# Patient Record
Sex: Female | Born: 1990 | Race: Black or African American | Hispanic: No | Marital: Single | Smoking: Never smoker
Health system: Southern US, Community
[De-identification: ages and names within clinical notes are randomized; demographics above are authoritative.]

## PROBLEM LIST (undated history)

## (undated) ENCOUNTER — Inpatient Hospital Stay (HOSPITAL_COMMUNITY): Payer: Self-pay

## (undated) DIAGNOSIS — Z789 Other specified health status: Secondary | ICD-10-CM

## (undated) HISTORY — DX: Other specified health status: Z78.9

---

## 2015-03-15 ENCOUNTER — Emergency Department (HOSPITAL_COMMUNITY)
Admission: EM | Admit: 2015-03-15 | Discharge: 2015-03-15 | Discharge status: Absent without leave | Payer: BLUE CROSS/BLUE SHIELD

## 2015-03-15 ENCOUNTER — Encounter (HOSPITAL_COMMUNITY): Payer: Self-pay | Admitting: Emergency Medicine

## 2015-03-15 ENCOUNTER — Emergency Department (HOSPITAL_COMMUNITY)
Admission: EM | Admit: 2015-03-15 | Discharge: 2015-03-15 | Disposition: A | Discharge status: Home / Self Care | Payer: BLUE CROSS/BLUE SHIELD | Attending: Emergency Medicine | Admitting: Emergency Medicine

## 2015-03-15 DIAGNOSIS — R110 Nausea: Secondary | ICD-10-CM | POA: Diagnosis not present

## 2015-03-15 DIAGNOSIS — R067 Sneezing: Secondary | ICD-10-CM | POA: Diagnosis not present

## 2015-03-15 DIAGNOSIS — R509 Fever, unspecified: Secondary | ICD-10-CM | POA: Diagnosis present

## 2015-03-15 DIAGNOSIS — N762 Acute vulvitis: Secondary | ICD-10-CM | POA: Diagnosis not present

## 2015-03-15 DIAGNOSIS — Z791 Long term (current) use of non-steroidal anti-inflammatories (NSAID): Secondary | ICD-10-CM | POA: Insufficient documentation

## 2015-03-15 DIAGNOSIS — L03818 Cellulitis of other sites: Secondary | ICD-10-CM

## 2015-03-15 DIAGNOSIS — Z79899 Other long term (current) drug therapy: Secondary | ICD-10-CM | POA: Diagnosis not present

## 2015-03-15 MED ORDER — CEPHALEXIN 500 MG PO CAPS: 500.0000 mg | ORAL_CAPSULE | Freq: Four times a day (QID) | ORAL | Status: DC

## 2015-03-15 NOTE — ED Notes (Signed)
Pt states she was seen by her OBGYN on Tuesday for an abscess  Pt was told to soak in hot baths several times a day until it came to a head and drained  Pt states today she has had chills, fever, sneezing, dizziness when standing, nausea, and states the pain of the abscess has increased tonight

## 2015-03-15 NOTE — Discharge Instructions (Signed)
Take your antibiotics as prescribed. Do not save or share them. Follow-up with your OB/GYN for reevaluation as needed. Return to ED for  Cellulitis Cellulitis is an infection of the skin and the tissue beneath it. The infected area is usually red and tender. Cellulitis occurs most often in the arms and lower legs.  CAUSES  Cellulitis is caused by bacteria that enter the skin through cracks or cuts in the skin. The most common types of bacteria that cause cellulitis are staphylococci and streptococci. SIGNS AND SYMPTOMS   Redness and warmth.  Swelling.  Tenderness or pain.  Fever. DIAGNOSIS  Your health care provider can usually determine what is wrong based on a physical exam. Blood tests may also be done. TREATMENT  Treatment usually involves taking an antibiotic medicine. HOME CARE INSTRUCTIONS   Take your antibiotic medicine as directed by your health care provider. Finish the antibiotic even if you start to feel better.  Keep the infected arm or leg elevated to reduce swelling.  Apply a warm cloth to the affected area up to 4 times per day to relieve pain.  Take medicines only as directed by your health care provider.  Keep all follow-up visits as directed by your health care provider. SEEK MEDICAL CARE IF:   You notice red streaks coming from the infected area.  Your red area gets larger or turns dark in color.  Your bone or joint underneath the infected area becomes painful after the skin has healed.  Your infection returns in the same area or another area.  You notice a swollen bump in the infected area.  You develop new symptoms.  You have a fever. SEEK IMMEDIATE MEDICAL CARE IF:   You feel very sleepy.  You develop vomiting or diarrhea.  You have a general ill feeling (malaise) with muscle aches and pains.   This information is not intended to replace advice given to you by your health care provider. Make sure you discuss any questions you have with your  health care provider.   Document Released: 10/09/2004 Document Revised: 09/20/2014 Document Reviewed: 03/17/2011 Elsevier Interactive Patient Education [employer]

## 2015-03-15 NOTE — ED Provider Notes (Signed)
CSN: [id number]     Arrival date & time 03/15/15  0232 History   First MD Initiated Contact with Patient 03/15/15 0248     Chief Complaint  Patient presents with  . Fever     (Consider location/radiation/quality/duration/timing/severity/associated sxs/prior Treatment) HPI Sheena Jackson is a 25 y.o. female because in for evaluation of possible abscess. Patient reports she fell and she has an ingrown hair on her vagina that has been there over the past 2 days. She was seen by her OB/GYN on Tuesday for an annual checkup and discussed with them. She clarifies she is not pregnant and was only going for an annual visit. They recommended warm compresses. She reports since her visit she has had intermittent flulike symptoms with fevers, chills, sneezing, nausea. She has not tried anything else to improve her symptoms. Palpation worsens her discomfort. No other modifying factors or medical complaints  History reviewed. No pertinent past medical history. History reviewed. No pertinent past surgical history. History reviewed. No pertinent family history. Social History  Substance Use Topics  . Smoking status: Never Smoker   . Smokeless tobacco: None  . Alcohol Use: Yes     Comment: occ   OB History    No data available     Review of Systems A 10 point review of systems was completed and was negative except for pertinent positives and negatives as mentioned in the history of present illness     Allergies  Review of patient's allergies indicates no known allergies.  Home Medications   Prior to Admission medications   Medication Sig Start Date End Date Taking? Authorizing Provider  cephALEXin (KEFLEX) 500 MG capsule Take 1 capsule (500 mg total) by mouth 4 (four) times daily. 03/15/15   Joycie Peek, PA-C  naproxen sodium (ANAPROX) 220 MG tablet Take 220 mg by mouth 2 (two) times daily with a meal.   Yes Historical Provider, MD  Phenylephrine-Pheniramine-DM [facility] COLD &  COUGH PO) Take 1 capsule by mouth daily.   Yes Historical Provider, MD   BP 120/80 mmHg  Pulse 89  Temp(Src) 97.4 F (36.3 C) (Oral)  Resp 17  SpO2 95%  LMP 03/06/2015 (Approximate) Physical Exam  Constitutional:  Awake, alert, nontoxic appearance.  HENT:  Head: Atraumatic.  Eyes: Right eye exhibits no discharge. Left eye exhibits no discharge.  Neck: Neck supple.  Pulmonary/Chest: Effort normal. She exhibits no tenderness.  Abdominal: Soft. There is no tenderness. There is no rebound.  Genitourinary:  Female chaperone present for exam. Tenderness diffusely throughout inferior aspect of right labia majora. No fluctuance, erythema or overt warmth noted.  Musculoskeletal: She exhibits no tenderness.  Baseline ROM, no obvious new focal weakness.  Neurological:  Mental status and motor strength appears baseline for patient and situation.  Skin: No rash noted.  Psychiatric: She has a normal mood and affect.  Nursing note and vitals reviewed.   ED Course  Procedures (including critical care time) Labs Review Labs Reviewed - No data to display  Imaging Review No results found. I have personally reviewed and evaluated these images and lab results as part of my medical decision-making.   EKG Interpretation None     EMERGENCY DEPARTMENT US SOFT TISSUE INTERPRETATION "Study: Limited Ultrasound of the noted body part in comments below"  INDICATIONS: Soft tissue infection Multiple views of the body part are obtained with a multi-frequency linear probe  PERFORMED BY:  Myself  IMAGES ARCHIVED?: Yes  SIDE:Right   BODY PART:Other soft tisse (comment  in note)  FINDINGS: No abcess noted and Cellulitis present  LIMITATIONS:  Body Habitus  INTERPRETATION:  No abcess noted and Cellulitis present  COMMENT:  Mild cellulitis of right labia. No abscess.  Meds given in ED:  Medications - No data to display  Discharge Medication List as of 03/15/2015  3:33 AM    START taking  these medications   Details  cephALEXin (KEFLEX) 500 MG capsule Take 1 capsule (500 mg total) by mouth 4 (four) times daily., Starting 03/15/2015, Until Discontinued, Print       Filed Vitals:   03/15/15 0239  BP: 120/80  Pulse: 89  Temp: 97.4 F (36.3 C)  TempSrc: Oral  Resp: 17  SpO2: 95%    MDM  Sheena Jackson is a 25 y.o. female who comes in for evaluation of mild perivaginal cellulitis. No abscess on ultrasound. Associated symptoms of intermittent fever, chills, sneezing and nausea likely secondary to viral etiology. We'll be able to follow-up with her OB/GYN for reevaluation. Plan to DC with Keflex. No evidence of other acute or emergent pathology. Patient overall appears ready well, afebrile, hemodynamically stable stable and appropriate for discharge. The patient appears reasonably screened and/or stabilized for discharge and I doubt any other medical condition or other [facility] requiring further screening, evaluation, or treatment in the ED at this time prior to discharge.   Final diagnoses:  Cellulitis of other specified site        Joycie Peek, PA-C 03/15/15 0355  Eber Hong, MD 03/15/15 [phone]

## 2015-06-22 LAB — OB RESULTS CONSOLE ABO/RH: RH Type: POSITIVE

## 2015-06-27 LAB — OB RESULTS CONSOLE GC/CHLAMYDIA
CHLAMYDIA, DNA PROBE: NEGATIVE
GC PROBE AMP, GENITAL: NEGATIVE

## 2015-08-10 LAB — OB RESULTS CONSOLE ANTIBODY SCREEN: ANTIBODY SCREEN: NEGATIVE

## 2015-08-10 LAB — OB RESULTS CONSOLE HGB/HCT, BLOOD
HCT: 36 %
HEMOGLOBIN: 11.5 g/dL

## 2015-08-10 LAB — OB RESULTS CONSOLE RUBELLA ANTIBODY, IGM: RUBELLA: IMMUNE

## 2015-08-10 LAB — OB RESULTS CONSOLE PLATELET COUNT: PLATELETS: 212 10*3/uL

## 2015-08-10 LAB — OB RESULTS CONSOLE HEPATITIS B SURFACE ANTIGEN: HEP B S AG: NEGATIVE

## 2015-08-10 LAB — OB RESULTS CONSOLE HIV ANTIBODY (ROUTINE TESTING): HIV: NONREACTIVE

## 2015-08-10 LAB — OB RESULTS CONSOLE RPR: RPR: NONREACTIVE

## 2015-10-19 ENCOUNTER — Encounter (HOSPITAL_COMMUNITY): Payer: Self-pay | Admitting: Obstetrics and Gynecology

## 2015-10-24 ENCOUNTER — Other Ambulatory Visit (HOSPITAL_COMMUNITY): Payer: Self-pay | Admitting: Obstetrics and Gynecology

## 2015-10-24 DIAGNOSIS — Z3A24 24 weeks gestation of pregnancy: Secondary | ICD-10-CM

## 2015-10-24 DIAGNOSIS — Z3689 Encounter for other specified antenatal screening: Secondary | ICD-10-CM

## 2015-10-25 ENCOUNTER — Encounter: Payer: Self-pay | Admitting: Obstetrics and Gynecology

## 2015-10-29 ENCOUNTER — Encounter (HOSPITAL_COMMUNITY): Payer: Self-pay

## 2015-10-29 ENCOUNTER — Inpatient Hospital Stay (HOSPITAL_COMMUNITY)
Admission: AD | Admit: 2015-10-29 | Discharge: 2015-10-29 | Disposition: A | Discharge status: Home / Self Care | Payer: BLUE CROSS/BLUE SHIELD | Source: Ambulatory Visit | Attending: Obstetrics & Gynecology | Admitting: Obstetrics & Gynecology

## 2015-10-29 DIAGNOSIS — Z3A24 24 weeks gestation of pregnancy: Secondary | ICD-10-CM | POA: Insufficient documentation

## 2015-10-29 DIAGNOSIS — O4692 Antepartum hemorrhage, unspecified, second trimester: Secondary | ICD-10-CM | POA: Insufficient documentation

## 2015-10-29 LAB — URINALYSIS, ROUTINE W REFLEX MICROSCOPIC
BILIRUBIN URINE: NEGATIVE
Glucose, UA: NEGATIVE mg/dL
Ketones, ur: NEGATIVE mg/dL
Leukocytes, UA: NEGATIVE
Nitrite: NEGATIVE
PROTEIN: NEGATIVE mg/dL
Specific Gravity, Urine: <1.005 — ABNORMAL LOW (ref 1.005–1.030)
pH: 5.5 (ref 5.0–8.0)

## 2015-10-29 LAB — WET PREP, GENITAL
Clue Cells Wet Prep HPF POC: NONE SEEN
SPERM: NONE SEEN
Trich, Wet Prep: NONE SEEN
Yeast Wet Prep HPF POC: NONE SEEN

## 2015-10-29 LAB — URINE MICROSCOPIC-ADD ON: WBC, UA: NONE SEEN WBC/hpf (ref 0–5)

## 2015-10-29 NOTE — MAU Note (Signed)
Pt presents complaining of vaginal bleeding after intercourse that started an hour ago. States it was a lot at first and now is spotting when she wipes. Denies pain. Reports decreased fetal movement. Last felt baby move earlier today. Denies abnormal discharge.

## 2015-10-29 NOTE — MAU Provider Note (Signed)
History     CSN: [id number]  Arrival date and time: 10/29/15 1859   First Provider Initiated Contact with Patient 10/29/15 1933      Chief Complaint  Patient presents with  . Vaginal Bleeding   HPI Sheena Jackson is a G1 P0 at 24 weeks and 2 days. She had intercourse this afternoon and then reported bright red bleeding afterwards and when she wiped. She is not actively bleeding now. She has denies cramping or leaking of fluid; she denies pain with urination or lower back pain. She reports positive fetal movements. She does not feel that she is at risk for any STIs.  OB History    Gravida Para Term Preterm AB Living   1             SAB TAB Ectopic Multiple Live Births                  History reviewed. No pertinent past medical history.  History reviewed. No pertinent surgical history.  History reviewed. No pertinent family history.  Social History  Substance Use Topics  . Smoking status: Never Smoker  . Smokeless tobacco: Not on file  . Alcohol use Yes     Comment: occ    Allergies: No Known Allergies  Prescriptions Prior to Admission  Medication Sig Dispense Refill Last Dose  . Phenylephrine-Pheniramine-DM (THERAFLU COLD & COUGH PO) Take 1 capsule by mouth daily.   03/14/2015 at Unknown time    Review of Systems  Constitutional: Negative.   HENT: Negative.   Eyes: Negative.   Respiratory: Negative.   Cardiovascular: Negative.   Gastrointestinal: Negative.   Genitourinary: Negative.   Musculoskeletal: Negative.   Skin: Negative.   Neurological: Negative.   Endo/Heme/Allergies: Negative.    Physical Exam   Blood pressure (!) 114/54, pulse 74, temperature 98 F (36.7 C), temperature source Oral, resp. rate 18, last menstrual period 03/06/2015.  Physical Exam  Constitutional: She is oriented to person, place, and time. She appears well-developed.  HENT:  Head: Normocephalic and atraumatic.  Neck: Normal range of motion.  Cardiovascular: Normal rate.    Respiratory: Effort normal.  Genitourinary: Vagina normal.  Musculoskeletal: Normal range of motion.  Neurological: She is alert and oriented to person, place, and time.  Skin: Skin is warm and dry.   Cervix is friable with blood pooling in the posterior vaginal fornix. No CMT. Vagina and labia are normal appearing without lesions or masses.    Results for orders placed or performed during the hospital encounter of 10/29/15 (from the past 24 hour(s))  Urinalysis, Routine w reflex microscopic ([facility])     Status: Abnormal   Collection Time: 10/29/15  7:04 PM  Result Value Ref Range   Color, Urine YELLOW YELLOW   APPearance CLEAR CLEAR   Specific Gravity, Urine <1.005 (L) 1.005 - 1.030   pH 5.5 5.0 - 8.0   Glucose, UA NEGATIVE NEGATIVE mg/dL   Hgb urine dipstick MODERATE (A) NEGATIVE   Bilirubin Urine NEGATIVE NEGATIVE   Ketones, ur NEGATIVE NEGATIVE mg/dL   Protein, ur NEGATIVE NEGATIVE mg/dL   Nitrite NEGATIVE NEGATIVE   Leukocytes, UA NEGATIVE NEGATIVE  Urine microscopic-add on     Status: Abnormal   Collection Time: 10/29/15  7:04 PM  Result Value Ref Range   Squamous Epithelial / LPF 0-5 (A) NONE SEEN   WBC, UA NONE SEEN 0 - 5 WBC/hpf   RBC / HPF 0-5 0 - 5 RBC/hpf   Bacteria, UA  RARE (A) NONE SEEN  Wet prep, genital     Status: Abnormal   Collection Time: 10/29/15  8:35 PM  Result Value Ref Range   Yeast Wet Prep HPF POC NONE SEEN NONE SEEN   Trich, Wet Prep NONE SEEN NONE SEEN   Clue Cells Wet Prep HPF POC NONE SEEN NONE SEEN   WBC, Wet Prep HPF POC FEW (A) NONE SEEN   Sperm NONE SEEN    MAU Course  Procedures Pelvic exam, UA, wet mount, GCT MDM Wet mount and GC pending.  D/W Dr. Mora Appl ok for DC home. Treat if anything shows on wet prep.   Assessment and Plan  Patient is a G1 P0 here for vaginal bleeding after intercourse. She denies cramping or abdominal pain; no pain with urination and no vaginal discharge. Wet mount and GC pending. Per Dr. Mora Appl, await  results of wet mount and treat as appropriate.  FHR is 140 with accelerations; no decelerations. No contractions present.   Charlesetta Garibaldi Kooistra CNM 10/29/2015, 7:53 PM

## 2015-10-30 ENCOUNTER — Encounter: Payer: Self-pay | Admitting: *Deleted

## 2015-10-30 ENCOUNTER — Other Ambulatory Visit (HOSPITAL_COMMUNITY): Payer: BLUE CROSS/BLUE SHIELD

## 2015-10-30 LAB — GC/CHLAMYDIA PROBE AMP (~~LOC~~) NOT AT ARMC
CHLAMYDIA, DNA PROBE: NEGATIVE
Neisseria Gonorrhea: NEGATIVE

## 2015-11-01 ENCOUNTER — Ambulatory Visit (HOSPITAL_COMMUNITY)
Admission: RE | Admit: 2015-11-01 | Discharge: 2015-11-01 | Disposition: A | Discharge status: Home / Self Care | Payer: BLUE CROSS/BLUE SHIELD | Source: Ambulatory Visit | Attending: Obstetrics and Gynecology | Admitting: Obstetrics and Gynecology

## 2015-11-01 ENCOUNTER — Other Ambulatory Visit (HOSPITAL_COMMUNITY): Payer: Self-pay | Admitting: Obstetrics and Gynecology

## 2015-11-01 DIAGNOSIS — O0932 Supervision of pregnancy with insufficient antenatal care, second trimester: Secondary | ICD-10-CM | POA: Diagnosis not present

## 2015-11-01 DIAGNOSIS — Z3689 Encounter for other specified antenatal screening: Secondary | ICD-10-CM

## 2015-11-01 DIAGNOSIS — Z3A24 24 weeks gestation of pregnancy: Secondary | ICD-10-CM

## 2015-11-01 DIAGNOSIS — Z363 Encounter for antenatal screening for malformations: Secondary | ICD-10-CM

## 2015-11-06 ENCOUNTER — Encounter: Payer: BLUE CROSS/BLUE SHIELD | Admitting: Obstetrics and Gynecology

## 2015-12-13 ENCOUNTER — Other Ambulatory Visit (HOSPITAL_COMMUNITY): Payer: Self-pay

## 2016-01-14 NOTE — L&D Delivery Note (Signed)
Delivery Note Patient pushed for 10 minutes. At 8:38 PM a viable female was delivered via Vaginal, Spontaneous Delivery (Presentation: Occiput; Anterior ).  APGAR: 9, 9; weight 7 lb 3.5 oz (3275 g).   Placenta status: Manual extraction due to cord avulsion,  Intact.  Cord: 3V with the following complications: None Cord pH: n/a  Anesthesia:  Epidural Episiotomy: None Lacerations: None Suture Repair: n/a Est. Blood Loss (mL): 200  Mom to postpartum.  Baby to Couplet care / Skin to Skin.  [facility] BayDYANNA GEFFEL Dijuan Sleeth 02/18/2016, 9:47 PM

## 2016-01-18 ENCOUNTER — Other Ambulatory Visit: Payer: Self-pay | Admitting: Obstetrics & Gynecology

## 2016-01-18 LAB — OB RESULTS CONSOLE GBS: STREP GROUP B AG: NEGATIVE

## 2016-02-18 ENCOUNTER — Inpatient Hospital Stay (HOSPITAL_COMMUNITY): Payer: BC Managed Care – PPO | Admitting: Anesthesiology

## 2016-02-18 ENCOUNTER — Encounter (HOSPITAL_COMMUNITY): Payer: Self-pay

## 2016-02-18 ENCOUNTER — Inpatient Hospital Stay (HOSPITAL_COMMUNITY)
Admission: AD | Admit: 2016-02-18 | Discharge: 2016-02-20 | DRG: 774 | Disposition: A | Discharge status: Home / Self Care | Payer: BC Managed Care – PPO | Source: Ambulatory Visit | Attending: Obstetrics | Admitting: Obstetrics

## 2016-02-18 DIAGNOSIS — A6000 Herpesviral infection of urogenital system, unspecified: Secondary | ICD-10-CM | POA: Diagnosis present

## 2016-02-18 DIAGNOSIS — O9832 Other infections with a predominantly sexual mode of transmission complicating childbirth: Secondary | ICD-10-CM | POA: Diagnosis present

## 2016-02-18 DIAGNOSIS — Z3A40 40 weeks gestation of pregnancy: Secondary | ICD-10-CM | POA: Diagnosis not present

## 2016-02-18 DIAGNOSIS — O368130 Decreased fetal movements, third trimester, not applicable or unspecified: Secondary | ICD-10-CM | POA: Diagnosis present

## 2016-02-18 DIAGNOSIS — Z3493 Encounter for supervision of normal pregnancy, unspecified, third trimester: Secondary | ICD-10-CM | POA: Diagnosis present

## 2016-02-18 LAB — CBC
HEMATOCRIT: 37.6 % (ref 36.0–46.0)
HEMOGLOBIN: 12.2 g/dL (ref 12.0–15.0)
MCH: 29.2 pg (ref 26.0–34.0)
MCHC: 32.4 g/dL (ref 30.0–36.0)
MCV: 90 fL (ref 78.0–100.0)
Platelets: 134 10*3/uL — ABNORMAL LOW (ref 150–400)
RBC: 4.18 MIL/uL (ref 3.87–5.11)
RDW: 14 % (ref 11.5–15.5)
WBC: 8.4 10*3/uL (ref 4.0–10.5)

## 2016-02-18 LAB — ABO/RH: ABO/RH(D): O POS

## 2016-02-18 LAB — TYPE AND SCREEN
ABO/RH(D): O POS
ANTIBODY SCREEN: NEGATIVE

## 2016-02-18 MED ORDER — EPHEDRINE 5 MG/ML INJ
10.0000 mg | INTRAVENOUS | Status: DC | PRN
Start: 2016-02-18 — End: 2016-02-18

## 2016-02-18 MED ORDER — ONDANSETRON HCL 4 MG PO TABS
4.0000 mg | ORAL_TABLET | ORAL | Status: DC | PRN
Start: 2016-02-18 — End: 2016-02-20

## 2016-02-18 MED ORDER — LACTATED RINGERS IV SOLN: 500.0000 mL | INTRAVENOUS | Status: DC | PRN

## 2016-02-18 MED ORDER — LACTATED RINGERS IV SOLN
500.0000 mL | Freq: Once | INTRAVENOUS | Status: AC
  Administered 2016-02-18: 500 mL via INTRAVENOUS

## 2016-02-18 MED ORDER — DIPHENHYDRAMINE HCL 25 MG PO CAPS: 25.0000 mg | ORAL_CAPSULE | Freq: Four times a day (QID) | ORAL | Status: DC | PRN

## 2016-02-18 MED ORDER — OXYCODONE HCL 5 MG PO TABS: 5.0000 mg | ORAL_TABLET | ORAL | Status: DC | PRN

## 2016-02-18 MED ORDER — FENTANYL 2.5 MCG/ML BUPIVACAINE 1/10 % EPIDURAL INFUSION (WH - ANES)
14.0000 mL/h | INTRAMUSCULAR | Status: DC | PRN
  Administered 2016-02-18: 14 mL/h via EPIDURAL
  Filled 2016-02-18: qty 100

## 2016-02-18 MED ORDER — WITCH HAZEL-GLYCERIN EX PADS: 1.0000 "application " | MEDICATED_PAD | CUTANEOUS | Status: DC | PRN

## 2016-02-18 MED ORDER — OXYTOCIN BOLUS FROM INFUSION
500.0000 mL | Freq: Once | INTRAVENOUS | Status: AC
  Administered 2016-02-18: 500 mL via INTRAVENOUS

## 2016-02-18 MED ORDER — ACETAMINOPHEN 325 MG PO TABS: 650.0000 mg | ORAL_TABLET | ORAL | Status: DC | PRN

## 2016-02-18 MED ORDER — PHENYLEPHRINE 40 MCG/ML (10ML) SYRINGE FOR IV PUSH (FOR BLOOD PRESSURE SUPPORT): 80.0000 ug | PREFILLED_SYRINGE | INTRAVENOUS | Status: DC | PRN

## 2016-02-18 MED ORDER — SIMETHICONE 80 MG PO CHEW: 80.0000 mg | CHEWABLE_TABLET | ORAL | Status: DC | PRN

## 2016-02-18 MED ORDER — DIPHENHYDRAMINE HCL 50 MG/ML IJ SOLN: 12.5000 mg | INTRAMUSCULAR | Status: DC | PRN

## 2016-02-18 MED ORDER — FENTANYL CITRATE (PF) 100 MCG/2ML IJ SOLN: 50.0000 ug | INTRAMUSCULAR | Status: DC | PRN

## 2016-02-18 MED ORDER — OXYCODONE-ACETAMINOPHEN 5-325 MG PO TABS: 1.0000 | ORAL_TABLET | ORAL | Status: DC | PRN

## 2016-02-18 MED ORDER — OXYCODONE-ACETAMINOPHEN 5-325 MG PO TABS: 2.0000 | ORAL_TABLET | ORAL | Status: DC | PRN

## 2016-02-18 MED ORDER — SENNOSIDES-DOCUSATE SODIUM 8.6-50 MG PO TABS
2.0000 | ORAL_TABLET | ORAL | Status: DC
  Administered 2016-02-19 (×2): 2 via ORAL
  Filled 2016-02-18 (×2): qty 2

## 2016-02-18 MED ORDER — OXYCODONE HCL 5 MG PO TABS: 10.0000 mg | ORAL_TABLET | ORAL | Status: DC | PRN

## 2016-02-18 MED ORDER — DIBUCAINE 1 % RE OINT: 1.0000 "application " | TOPICAL_OINTMENT | RECTAL | Status: DC | PRN

## 2016-02-18 MED ORDER — PRENATAL MULTIVITAMIN CH
1.0000 | ORAL_TABLET | Freq: Every day | ORAL | Status: DC
  Administered 2016-02-19 – 2016-02-20 (×2): 1 via ORAL
  Filled 2016-02-18 (×2): qty 1

## 2016-02-18 MED ORDER — LACTATED RINGERS IV SOLN
INTRAVENOUS | Status: DC
  Administered 2016-02-18: 17:00:00 via INTRAVENOUS

## 2016-02-18 MED ORDER — ONDANSETRON HCL 4 MG/2ML IJ SOLN: 4.0000 mg | INTRAMUSCULAR | Status: DC | PRN

## 2016-02-18 MED ORDER — BENZOCAINE-MENTHOL 20-0.5 % EX AERO
1.0000 "application " | INHALATION_SPRAY | CUTANEOUS | Status: DC | PRN
  Administered 2016-02-19: 1 via TOPICAL
  Filled 2016-02-18: qty 56

## 2016-02-18 MED ORDER — TETANUS-DIPHTH-ACELL PERTUSSIS 5-2.5-18.5 LF-MCG/0.5 IM SUSP
0.5000 mL | Freq: Once | INTRAMUSCULAR | Status: DC
  Filled 2016-02-18: qty 0.5

## 2016-02-18 MED ORDER — EPHEDRINE 5 MG/ML INJ: 10.0000 mg | INTRAVENOUS | Status: DC | PRN

## 2016-02-18 MED ORDER — SOD CITRATE-CITRIC ACID 500-334 MG/5ML PO SOLN: 30.0000 mL | ORAL | Status: DC | PRN

## 2016-02-18 MED ORDER — LIDOCAINE HCL (PF) 1 % IJ SOLN
INTRAMUSCULAR | Status: DC | PRN
Start: 2016-02-18 — End: 2016-02-18
  Administered 2016-02-18 (×2): 5 mL

## 2016-02-18 MED ORDER — COCONUT OIL OIL: 1.0000 "application " | TOPICAL_OIL | Status: DC | PRN

## 2016-02-18 MED ORDER — OXYTOCIN 40 UNITS IN LACTATED RINGERS INFUSION - SIMPLE MED
2.5000 [IU]/h | INTRAVENOUS | Status: DC
  Administered 2016-02-18: 2.5 [IU]/h via INTRAVENOUS
  Filled 2016-02-18: qty 1000

## 2016-02-18 MED ORDER — ONDANSETRON HCL 4 MG/2ML IJ SOLN: 4.0000 mg | Freq: Four times a day (QID) | INTRAMUSCULAR | Status: DC | PRN

## 2016-02-18 MED ORDER — LIDOCAINE HCL (PF) 1 % IJ SOLN
30.0000 mL | INTRAMUSCULAR | Status: DC | PRN
  Filled 2016-02-18: qty 30

## 2016-02-18 MED ORDER — IBUPROFEN 600 MG PO TABS
600.0000 mg | ORAL_TABLET | Freq: Four times a day (QID) | ORAL | Status: DC
  Administered 2016-02-19 – 2016-02-20 (×7): 600 mg via ORAL
  Filled 2016-02-18 (×7): qty 1

## 2016-02-18 MED ORDER — PHENYLEPHRINE 40 MCG/ML (10ML) SYRINGE FOR IV PUSH (FOR BLOOD PRESSURE SUPPORT)
80.0000 ug | PREFILLED_SYRINGE | INTRAVENOUS | Status: DC | PRN
  Filled 2016-02-18: qty 10

## 2016-02-18 NOTE — Anesthesia Procedure Notes (Signed)
Epidural Patient location during procedure: OB  Staffing Anesthesiologist: Phillips GroutARIGNAN, Jenni Thew Performed: anesthesiologist   Preanesthetic Checklist Completed: patient identified, site marked, surgical consent, pre-op evaluation, timeout performed, IV checked, risks and benefits discussed and monitors and equipment checked  Epidural Patient position: sitting Prep: DuraPrep Patient monitoring: heart rate, continuous pulse ox and blood pressure Approach: midline Location: L3-L4 Injection technique: LOR saline  Needle:  Needle type: Tuohy  Needle gauge: 17 G Needle length: 9 cm and 9 Needle insertion depth: 5 cm Catheter type: closed end flexible Catheter size: 20 Guage Catheter at skin depth: 10 cm Test dose: negative  Assessment Events: blood not aspirated, injection not painful, no injection resistance, negative IV test and no paresthesia  Additional Notes Patient identified. Risks/Benefits/Options discussed with patient including but not limited to bleeding, infection, nerve damage, paralysis, failed block, incomplete pain control, headache, blood pressure changes, nausea, vomiting, reactions to medication both or allergic, itching and postpartum back pain. Confirmed with bedside nurse the patient's most recent platelet count. Confirmed with patient that they are not currently taking any anticoagulation, have any bleeding history or any family history of bleeding disorders. Patient expressed understanding and wished to proceed. All questions were answered. Sterile technique was used throughout the entire procedure. Please see nursing notes for vital signs. Test dose was given through epidural needle and negative prior to continuing to dose epidural or start infusion. Warning signs of high block given to the patient including shortness of breath, tingling/numbness in hands, complete motor block, or any concerning symptoms with instructions to call for help. Patient was given instructions  on fall risk and not to get out of bed. All questions and concerns addressed with instructions to call with any issues.

## 2016-02-18 NOTE — Anesthesia Preprocedure Evaluation (Signed)

## 2016-02-18 NOTE — H&P (Signed)
26 y.o. G1P0 @ 5341w2d presents with painful contractions.  Was seen in the office today for decreased fetal movement and had a reactive NST.  Otherwise has good fetal movement and no bleeding.  Pregnancy c/b:  1. 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  Last outbreak > 1 yr ago.  No prodromal symptoms  Past Medical History:  Diagnosis Date  . Medical history non-contributory    History reviewed. No pertinent surgical history.  OB History  Gravida Para Term Preterm AB Living  1            SAB TAB Ectopic Multiple Live Births               # Outcome Date GA Lbr Len/2nd Weight Sex Delivery Anes PTL Lv  1 Current               Social History   Social History  . Marital status: Single    Spouse name: N/A  . Number of children: N/A  . Years of education: N/A   Occupational History  . Not on file.   Social History Main Topics  . Smoking status: Never Smoker  . Smokeless tobacco: Not on file  . Alcohol use Yes     Comment: occ  . Drug use: No  . Sexual activity: Yes    Birth control/ protection: None   Other Topics Concern  . Not on file   Social History Narrative  . No narrative on file   Patient has no known allergies.    Prenatal Transfer Tool  Maternal Diabetes: No Genetic Screening: Normal  NIPT low risk, neg CF screen Maternal Ultrasounds/Referrals: Normal Fetal Ultrasounds or other Referrals:  None Maternal Substance Abuse:  No Significant Maternal Medications:  Meds include: Other:  valtrex Significant Maternal Lab Results: None  ABO, Rh: --/--/O POS (02/05 1640) Antibody: PENDING (02/05 1640) Rubella: Immune (07/28 0000) RPR: Nonreactive (07/28 0000)  HBsAg: Negative (07/28 0000)  HIV: Non-reactive (07/28 0000)  GBS: Negative (01/05 0000)      Vitals:   02/18/16 1607 02/18/16 1628  BP: 126/63 129/85  Pulse: 77 73  Resp: 20 20  Temp: 97.6 F (36.4 C) 97.9 F (36.6 C)     General:  NAD Abdomen:  soft, gravid, EFW 6.5# Ex:  no  edema SVE:  6/90/-1 per RN SSE:  No lesions of vulva, vagina, or cervix FHTs:  150s, mod var, + accels, no decels Toco:  q2-3 min   A/P   26 y.o. G1P0 9841w2d presents with labor Admit to L&D Epidural now HSV--no lesions, no prodromal symptoms FSR/ vtx/ GBS neg  Jari Dipasquale GEFFEL Miliyah Luper

## 2016-02-18 NOTE — MAU Note (Signed)
Seen in office today, was 80/2+.  Membranes stripped.  Small amt of bleeding since. First baby, no probles with preg

## 2016-02-19 LAB — CBC
HEMATOCRIT: 33.4 % — AB (ref 36.0–46.0)
HEMOGLOBIN: 11 g/dL — AB (ref 12.0–15.0)
MCH: 29.5 pg (ref 26.0–34.0)
MCHC: 32.9 g/dL (ref 30.0–36.0)
MCV: 89.5 fL (ref 78.0–100.0)
Platelets: 126 10*3/uL — ABNORMAL LOW (ref 150–400)
RBC: 3.73 MIL/uL — AB (ref 3.87–5.11)
RDW: 14.1 % (ref 11.5–15.5)
WBC: 12.7 10*3/uL — ABNORMAL HIGH (ref 4.0–10.5)

## 2016-02-19 LAB — RPR: RPR Ser Ql: NONREACTIVE

## 2016-02-19 NOTE — Lactation Note (Signed)
This note was copied from a baby's chart. Lactation Consultation Note  Patient Name: Girl Joaquim Lailexis Montante ZOXWR'UToday's Date: 02/19/2016    Baby 16 hours old.  P1.  Maternal Grandparents in room. Baby is cueing.  Suggest mother breastfeed.  Mother states she wants to breastfeed to give baby colostrum but is unsure how long she wants to breastfeed. Maternal Grandmother stating baby is hungry and should give baby formula. LC suggest she will support mother however she wants to feed her baby. Provided education and assisted w/ latching.  Baby is cluster feeding.  Explained to family. Mom encouraged to feed baby 8-12 times/24 hours and with feeding cues.  Mother seems unsure at this time.  Encouraged breastfeeding but want to support family. Recommend mother call if she would like assistance.   Maternal Data    Feeding    LATCH Score/Interventions                      Lactation Tools Discussed/Used     Consult Status      Hardie PulleyBerkelhammer, Ruth Boschen 02/19/2016, 1:40 PM

## 2016-02-19 NOTE — Lactation Note (Addendum)
This note was copied from a baby's chart. Lactation Consultation Note New mom has erect cone shaped breast w/everted nipples on bulbous areolas. Hand expression taught w/return demonstration of easy flow of colostrum. Latched baby in football hold. Heard audible swallows.  New parents had lots of questions. Education of newborn behavior, STS, I&O, cluster feeding, supply and demand. [facility] completing consult, asked mom what was her feeding plan when she goes home. Mom only plans to BF while she has colostrum, then she if switching to formula at home. Mom is going to work and be not be pumping at work. Praised mom for easy flow of colostrum and she was giving baby great nutrition. Mom stated she knew that, that is why she is going to give the colostrum. Mom stated she didn't want to BF all the time, she wanted to give bottles and formula.  [facility] brochure given w/resources, support groups and LC services. LC hoping mom will change her mind when returns home. Baby latched so well, has a great colostrum supply.  Patient Name: Sheena Jackson ZOXWR'UToday's Date: 02/19/2016 Reason for consult: Initial assessment   Maternal Data Has patient been taught Hand Expression?: Yes Does the patient have breastfeeding experience prior to this delivery?: No  Feeding Feeding Type: Breast Fed Length of feed: 30 min  LATCH Score/Interventions Latch: Grasps breast easily, tongue down, lips flanged, rhythmical sucking. Intervention(s): Adjust position;Assist with latch;Breast massage;Breast compression  Audible Swallowing: Spontaneous and intermittent Intervention(s): Skin to skin;Hand expression Intervention(s): Alternate breast massage  Type of Nipple: Everted at rest and after stimulation  Comfort (Breast/Nipple): Soft / non-tender     Hold (Positioning): Assistance needed to correctly position infant at breast and maintain latch. Intervention(s): Breastfeeding basics reviewed;Support Pillows;Position  options;Skin to skin  LATCH Score: 9  Lactation Tools Discussed/Used Tools: Pump Breast pump type: Manual   Consult Status Consult Status: Follow-up Date: 02/20/16 Follow-up type: In-patient    Eleasha Cataldo, Diamond NickelLAURA G 02/19/2016, 5:25 AM

## 2016-02-19 NOTE — Progress Notes (Signed)
PPD#1 Pt without complaints. HGB-11 Lochia wnl IMP/ doing well Plan/ Discharge in am.

## 2016-02-19 NOTE — Anesthesia Postprocedure Evaluation (Signed)
Anesthesia Post Note  Patient: Sheena Jackson  Procedure(s) Performed: * No procedures listed *  Patient location during evaluation: Mother Baby Anesthesia Type: Epidural Level of consciousness: awake Pain management: pain level controlled Vital Signs Assessment: post-procedure vital signs reviewed and stable Respiratory status: spontaneous breathing Cardiovascular status: stable Postop Assessment: no headache, no backache, epidural receding, patient able to bend at knees, no signs of nausea or vomiting and adequate PO intake Anesthetic complications: no        Last Vitals:  Vitals:   02/19/16 0316 02/19/16 0602  BP: 121/70 118/74  Pulse: 73 79  Resp: 18 18  Temp: 36.8 C 36.7 C    Last Pain:  Vitals:   02/19/16 0619  TempSrc:   PainSc: 1    Pain Goal:                 [name]

## 2016-02-20 MED ORDER — IBUPROFEN 600 MG PO TABS: 600.0000 mg | ORAL_TABLET | Freq: Four times a day (QID) | ORAL | 3 refills | Status: AC | PRN

## 2016-02-20 NOTE — Lactation Note (Signed)
This note was copied from a baby's chart. Lactation Consultation Note  Patient Name: Sheena Jackson WUJWJ'XToday's Date: 02/20/2016 Reason for consult: Follow-up assessment Baby recently BF and asleep, Mom resting. LC left phone number and encouraged Mom to call with next feeding.   Maternal Data    Feeding Feeding Type: Breast Fed  LATCH Score/Interventions Latch: Grasps breast easily, tongue down, lips flanged, rhythmical sucking. Intervention(s): Adjust position;Assist with latch;Breast massage  Audible Swallowing: Spontaneous and intermittent  Type of Nipple: Everted at rest and after stimulation  Comfort (Breast/Nipple): Filling, red/small blisters or bruises, mild/mod discomfort  Problem noted: Filling Interventions (Filling): Massage;Hand pump  Hold (Positioning): Assistance needed to correctly position infant at breast and maintain latch.  LATCH Score: 8  Lactation Tools Discussed/Used     Consult Status Consult Status: Follow-up Date: 02/20/16 Follow-up type: In-patient    Alfred LevinsGranger, Kavin Weckwerth Ann 02/20/2016, 10:27 AM

## 2016-02-20 NOTE — Discharge Summary (Signed)
Obstetric Discharge Summary Reason for Admission: onset of labor Prenatal Procedures: NST Intrapartum Procedures: spontaneous vaginal delivery Postpartum Procedures: none Complications-Operative and Postpartum: none Hemoglobin  Date Value Ref Range Status  02/19/2016 11.0 (L) 12.0 - 15.0 g/dL Final  16/10/960407/28/2017 54.011.5 g/dL Final   HCT  Date Value Ref Range Status  02/19/2016 33.4 (L) 36.0 - 46.0 % Final  08/10/2015 36 % Final    Physical Exam:  General: alert, cooperative and no distress Lochia: appropriate Uterine Fundus: firm perineum: healing well, no significant drainage, no dehiscence, no significant erythema DVT Evaluation: No evidence of DVT seen on physical exam. Negative Homan's sign. No cords or calf tenderness. No significant calf/ankle edema.  Discharge Diagnoses: Term Pregnancy-delivered  Discharge Information: Date: 02/20/2016 Activity: pelvic rest Diet: routine Medications: PNV and Ibuprofen Condition: stable Instructions: refer to practice specific booklet Discharge to: home   Newborn Data: Live born female  Birth Weight: 7 lb 3.5 oz (3275 g) APGAR: 9, 9  Home with mother.  Essie HartINN, Hilmer Aliberti STACIA 02/20/2016, 7:41 AM

## 2016-02-20 NOTE — Progress Notes (Signed)
Post Partum Day 2 Subjective: no complaints, up ad lib, voiding, tolerating PO, + flatus and breast feeding  Objective: Blood pressure (!) 105/57, pulse 66, temperature 98.2 F (36.8 C), resp. rate 18, height 5\' 9"  (1.753 m), weight 74.8 kg (165 lb), last menstrual period 05/06/2015, SpO2 98 %, unknown if currently breastfeeding.  Physical Exam:  General: alert, cooperative and no distress Lochia: appropriate Uterine Fundus: firm perineum: healing well, no significant drainage, no dehiscence, no significant erythema DVT Evaluation: No evidence of DVT seen on physical exam. Negative Homan's sign. No cords or calf tenderness. No significant calf/ankle edema.   Recent Labs  02/18/16 1640 02/19/16 0608  HGB 12.2 11.0*  HCT 37.6 33.4*    Assessment/Plan: Discharge home and Breastfeeding   LOS: 2 days   Kevina Piloto STACIA 02/20/2016, 7:38 AM

## 2016-02-20 NOTE — Lactation Note (Signed)
This note was copied from a baby's chart. Lactation Consultation Note  Patient Name: Sheena Jackson [id number]'s Date: 02/20/2016 Reason for consult: Follow-up assessment Mom's breast are filling, Assisted Mom with using breast compression for baby to obtain depth with latch. Baby fussy so took few attempts for baby to sustain latch but once she did, baby demonstrated good suckling bursts with some swallows noted. Encouraged Mom to support breast with feedings. Reviewed importance of deep latch to milk transfer, limit pacifier use, reviewed risk of pacifier use. Advised Mom to pre-pump to help with latch, post pump as needed to prevent engorgement. Advised to give baby back any amount of EBM she receives with pumping. Advised baby should be at breast 8-12 times in 24 hours and with feeding ques. Keep baby nursing 15-30 minutes both breasts when possible. Advised of OP services. Mom has Peds f/u tomorrow. Mom to call for questions/concerns.   Maternal Data    Feeding Feeding Type: Breast Fed Length of feed: 30 min  LATCH Score/Interventions Latch: Repeated attempts needed to sustain latch, nipple held in mouth throughout feeding, stimulation needed to elicit sucking reflex. Intervention(s): Adjust position;Assist with latch;Breast massage;Breast compression  Audible Swallowing: Spontaneous and intermittent  Type of Nipple: Everted at rest and after stimulation  Comfort (Breast/Nipple): Filling, red/small blisters or bruises, mild/mod discomfort  Problem noted: Filling Interventions (Filling): Massage;Hand pump  Hold (Positioning): Assistance needed to correctly position infant at breast and maintain latch. Intervention(s): Breastfeeding basics reviewed;Support Pillows;Position options;Skin to skin  LATCH Score: 7  Lactation Tools Discussed/Used Tools: Pump Breast pump type: Manual   Consult Status Consult Status: Complete Date: 02/20/16 Follow-up type: In-patient    Sheena Jackson,  Sheena Jackson 02/20/2016, 12:23 PM

## 2018-07-13 ENCOUNTER — Other Ambulatory Visit: Payer: Self-pay

## 2018-07-13 ENCOUNTER — Encounter (HOSPITAL_COMMUNITY): Payer: Self-pay

## 2018-07-13 ENCOUNTER — Emergency Department (HOSPITAL_COMMUNITY)
Admission: EM | Admit: 2018-07-13 | Discharge: 2018-07-14 | Discharge status: Against Medical Advice | Payer: BC Managed Care – PPO | Attending: Emergency Medicine | Admitting: Emergency Medicine

## 2018-07-13 DIAGNOSIS — Z5321 Procedure and treatment not carried out due to patient leaving prior to being seen by health care provider: Secondary | ICD-10-CM | POA: Insufficient documentation

## 2018-07-13 DIAGNOSIS — R05 Cough: Secondary | ICD-10-CM | POA: Diagnosis present

## 2018-07-13 NOTE — ED Triage Notes (Signed)
Pt reports sore throat for a week and chills that started today. Also reports tightness in chest and fatigue.  Pt afebrile in triage. Alert & approp.

## 2018-07-14 ENCOUNTER — Other Ambulatory Visit: Payer: Self-pay | Admitting: *Deleted

## 2018-07-14 DIAGNOSIS — Z20822 Contact with and (suspected) exposure to covid-19: Secondary | ICD-10-CM

## 2018-07-14 NOTE — ED Notes (Signed)
Called x3 for vitals. No answer. 

## 2018-07-20 LAB — NOVEL CORONAVIRUS, NAA: SARS-CoV-2, NAA: NOT DETECTED

## 2019-02-14 ENCOUNTER — Emergency Department (HOSPITAL_COMMUNITY): Payer: BC Managed Care – PPO

## 2019-02-14 ENCOUNTER — Emergency Department (HOSPITAL_COMMUNITY)
Admission: EM | Admit: 2019-02-14 | Discharge: 2019-02-14 | Disposition: A | Discharge status: Home / Self Care | Payer: BC Managed Care – PPO | Attending: Emergency Medicine | Admitting: Emergency Medicine

## 2019-02-14 ENCOUNTER — Encounter (HOSPITAL_COMMUNITY): Payer: Self-pay | Admitting: Emergency Medicine

## 2019-02-14 ENCOUNTER — Other Ambulatory Visit: Payer: Self-pay

## 2019-02-14 DIAGNOSIS — Y999 Unspecified external cause status: Secondary | ICD-10-CM | POA: Insufficient documentation

## 2019-02-14 DIAGNOSIS — Y92009 Unspecified place in unspecified non-institutional (private) residence as the place of occurrence of the external cause: Secondary | ICD-10-CM | POA: Insufficient documentation

## 2019-02-14 DIAGNOSIS — S92515A Nondisplaced fracture of proximal phalanx of left lesser toe(s), initial encounter for closed fracture: Secondary | ICD-10-CM

## 2019-02-14 DIAGNOSIS — Y9301 Activity, walking, marching and hiking: Secondary | ICD-10-CM | POA: Diagnosis not present

## 2019-02-14 DIAGNOSIS — R519 Headache, unspecified: Secondary | ICD-10-CM | POA: Insufficient documentation

## 2019-02-14 DIAGNOSIS — R55 Syncope and collapse: Secondary | ICD-10-CM | POA: Insufficient documentation

## 2019-02-14 DIAGNOSIS — S99922A Unspecified injury of left foot, initial encounter: Secondary | ICD-10-CM | POA: Diagnosis present

## 2019-02-14 DIAGNOSIS — W2209XA Striking against other stationary object, initial encounter: Secondary | ICD-10-CM | POA: Insufficient documentation

## 2019-02-14 LAB — I-STAT BETA HCG BLOOD, ED (NOT ORDERABLE): I-stat hCG, quantitative: <5 m[IU]/mL (ref <5)

## 2019-02-14 LAB — URINALYSIS, ROUTINE W REFLEX MICROSCOPIC
Bacteria, UA: NONE SEEN
Bilirubin Urine: NEGATIVE
Glucose, UA: NEGATIVE mg/dL
Hgb urine dipstick: NEGATIVE
Ketones, ur: NEGATIVE mg/dL
Nitrite: NEGATIVE
Protein, ur: NEGATIVE mg/dL
Specific Gravity, Urine: 1.025 (ref 1.005–1.030)
pH: 6 (ref 5.0–8.0)

## 2019-02-14 LAB — CBC
HCT: 38.6 % (ref 36.0–46.0)
Hemoglobin: 12 g/dL (ref 12.0–15.0)
MCH: 28.7 pg (ref 26.0–34.0)
MCHC: 31.1 g/dL (ref 30.0–36.0)
MCV: 92.3 fL (ref 80.0–100.0)
Platelets: 189 10*3/uL (ref 150–400)
RBC: 4.18 MIL/uL (ref 3.87–5.11)
RDW: 12.5 % (ref 11.5–15.5)
WBC: 4.8 10*3/uL (ref 4.0–10.5)
nRBC: 0 % (ref 0.0–0.2)

## 2019-02-14 LAB — BASIC METABOLIC PANEL
Anion gap: 7 (ref 5–15)
BUN: 21 mg/dL — ABNORMAL HIGH (ref 6–20)
CO2: 25 mmol/L (ref 22–32)
Calcium: 9.1 mg/dL (ref 8.9–10.3)
Chloride: 105 mmol/L (ref 98–111)
Creatinine, Ser: 1.08 mg/dL — ABNORMAL HIGH (ref 0.44–1.00)
GFR calc Af Amer: >60 mL/min (ref >60)
GFR calc non Af Amer: >60 mL/min (ref >60)
Glucose, Bld: 98 mg/dL (ref 70–99)
Potassium: 3.9 mmol/L (ref 3.5–5.1)
Sodium: 137 mmol/L (ref 135–145)

## 2019-02-14 MED ORDER — IBUPROFEN 200 MG PO TABS
400.0000 mg | ORAL_TABLET | Freq: Once | ORAL | Status: AC
  Administered 2019-02-14: 400 mg via ORAL
  Filled 2019-02-14: qty 2

## 2019-02-14 MED ORDER — SODIUM CHLORIDE 0.9% FLUSH: 3.0000 mL | Freq: Once | INTRAVENOUS | Status: DC

## 2019-02-14 NOTE — ED Provider Notes (Signed)
[facility] DEPT Provider Note   CSN: [id number] Arrival date & time: 02/14/19  1847     History Chief Complaint  Patient presents with  . Loss of Consciousness  . Toe Injury    Zylah Elsbernd is a 29 y.o. female.  The history is provided by the patient. No language interpreter was used.  Loss of Consciousness  Kajuana Shareef is a 29 y.o. female who presents to the Emergency Department complaining of toe injury, syncope. At home she was walking and accidentally kicked the fireplace with her left second toe. She experienced immediate severe pain and bent over to reach the toe was breathing heavy. She then fell backwards, passing out and striking her head. When she awoke she had mild dizziness and headache. Currently she is feeling improved but does have mild headache and complains of ongoing pain to the toe. She denies any recent illnesses. She denies any fevers, chest pain, Donald pain, nausea, vomiting, leg swelling. She has no medical problems and takes no medications. She does have a Mirena IUD.    Past Medical History:  Diagnosis Date  . Medical history non-contributory     Patient Active Problem List   Diagnosis Date Noted  . Normal labor 02/18/2016    History reviewed. No pertinent surgical history.   OB History    Gravida  1   Para  1   Term  1   Preterm      AB      Living  1     SAB      TAB      Ectopic      Multiple  0   Live Births  1           Family History  Problem Relation Age of Onset  . Hypertension Mother     Social History   Tobacco Use  . Smoking status: Never Smoker  . Smokeless tobacco: Never Used  Substance Use Topics  . Alcohol use: Yes    Comment: occ  . Drug use: No    Home Medications Prior to Admission medications   Medication Sig Start Date End Date Taking? Authorizing Provider  valACYclovir (VALTREX) 500 MG tablet Take 500 mg by mouth 2 (two) times  daily as needed.    Yes [provider]  ibuprofen (ADVIL,MOTRIN) 600 MG tablet Take 1 tablet (600 mg total) by mouth every 6 (six) hours as needed for cramping. Patient not taking: Reported on 02/14/2019 02/20/16   Sanjuana Kava, MD    Allergies    Patient has no known allergies.  Review of Systems   Review of Systems  Cardiovascular: Positive for syncope.  All other systems reviewed and are negative.   Physical Exam Updated Vital Signs BP 116/77   Pulse 74   Temp 98.6 F (37 C) (Oral)   Resp 20   Ht 5\' 9"  (1.753 m)   Wt 70.3 kg   SpO2 100%   BMI 22.89 kg/m   Physical Exam Vitals and nursing note reviewed.  Constitutional:      Appearance: She is well-developed.  HENT:     Head: Normocephalic and atraumatic.  Cardiovascular:     Rate and Rhythm: Normal rate and regular rhythm.     Heart sounds: No murmur.  Pulmonary:     Effort: Pulmonary effort is normal. No respiratory distress.     Breath sounds: Normal breath sounds.  Abdominal:     Palpations:  Abdomen is soft.     Tenderness: There is no abdominal tenderness. There is no guarding or rebound.  Musculoskeletal:     Comments: 2+ DP pulses bilaterally. There is tenderness in mild swelling and ecchymosis to the left second toe. There is no midfoot tenderness to palpation. No calf tenderness or swelling.  Skin:    General: Skin is warm and dry.  Neurological:     Mental Status: She is alert and oriented to person, place, and time.     Comments: Five out of five strength in all four extremities with sensation to light touch intact in all four extremities  Psychiatric:        Behavior: Behavior normal.     ED Results / Procedures / Treatments   Labs (all labs ordered are listed, but only abnormal results are displayed) Labs Reviewed  BASIC METABOLIC PANEL - Abnormal; Notable for the following components:      Result Value   BUN 21 (*)    Creatinine, Ser 1.08 (*)    All other components within normal  limits  URINALYSIS, ROUTINE W REFLEX MICROSCOPIC - Abnormal; Notable for the following components:   Leukocytes,Ua SMALL (*)    All other components within normal limits  CBC  I-STAT BETA HCG BLOOD, ED ([facility], [facility], [facility] ONLY)  I-STAT BETA HCG BLOOD, ED (NOT ORDERABLE)  CBG MONITORING, ED    EKG EKG Interpretation  Date/Time:  Monday February 14 2019 19:15:13 EST Ventricular Rate:  69 PR Interval:    QRS Duration: 98 QT Interval:  401 QTC Calculation: 430 R Axis:   59 Text Interpretation: Sinus rhythm RSR' in V1 or V2, right VCD or RVH no prior available for comparison Confirmed by Tilden Fossa [id number]) on 02/14/2019 9:22:09 PM   Radiology DG Foot Complete Left  Result Date: 02/14/2019 CLINICAL DATA:  Left foot pain status post trauma EXAM: LEFT FOOT - COMPLETE 3+ VIEW COMPARISON:  None. FINDINGS: There is displaced fracture of the second proximal phalanx. No other acute displaced fracture or dislocation is identified. IMPRESSION: Fracture of the second proximal phalanx. Electronically Signed   By: Sherian Rein M.D.   On: 02/14/2019 20:02    Procedures Procedures (including critical care time)  Medications Ordered in ED Medications  sodium chloride flush (NS) 0.9 % injection 3 mL (has no administration in time range)  ibuprofen (ADVIL) tablet 400 mg (has no administration in time range)    ED Course  I have reviewed the triage vital signs and the nursing notes.  Pertinent labs & imaging results that were available during my care of the patient were reviewed by me and considered in my medical decision making (see chart for details).    MDM Rules/Calculators/A&P                      Patient here for evaluation of toe pain, syncopal episode. CP likely secondary to pain response. Presentation is not consistent with subarachnoid hemorrhage, PE, serious closed head injury. She does have his second toe fracture that is not significantly displaced. Discussed with patient home care  with buddy tape, postop shoe. Creatinine is mildly elevated, this may be the patient's baseline. Discussed outpatient PCP follow-up for recheck. Final Clinical Impression(s) / ED Diagnoses Final diagnoses:  Closed nondisplaced fracture of proximal phalanx of lesser toe of left foot, initial encounter  Syncope, unspecified syncope type    Rx / DC Orders ED Discharge Orders    None  Tilden Fossa, MD 02/14/19 2215

## 2019-02-14 NOTE — ED Notes (Signed)
Pt refuses post op shoe.

## 2019-02-14 NOTE — ED Notes (Signed)
Patient ambulated out of the department with no difficulty   

## 2019-02-14 NOTE — Discharge Instructions (Addendum)
Creatinine, which is part of your kidney function was mildly elevated today. Please follow-up with your doctor for recheck in one week.

## 2019-02-14 NOTE — ED Triage Notes (Signed)
Pt reports hitting left second toe on edge of fireplace then bent down and fell and syncopal episode. Pt reports still feeling dizzy at this time and pain to posterior head.

## 2019-02-14 NOTE — ED Notes (Signed)
Save blue in main lab 

## 2021-01-26 IMAGING — CR DG FOOT COMPLETE 3+V*L*
3 series · 3 of 3 positions shown · non-contrast
Comparison: None.

CLINICAL DATA: Left foot pain status post trauma

EXAM:
LEFT FOOT - COMPLETE 3+ VIEW

[x foot ap left]
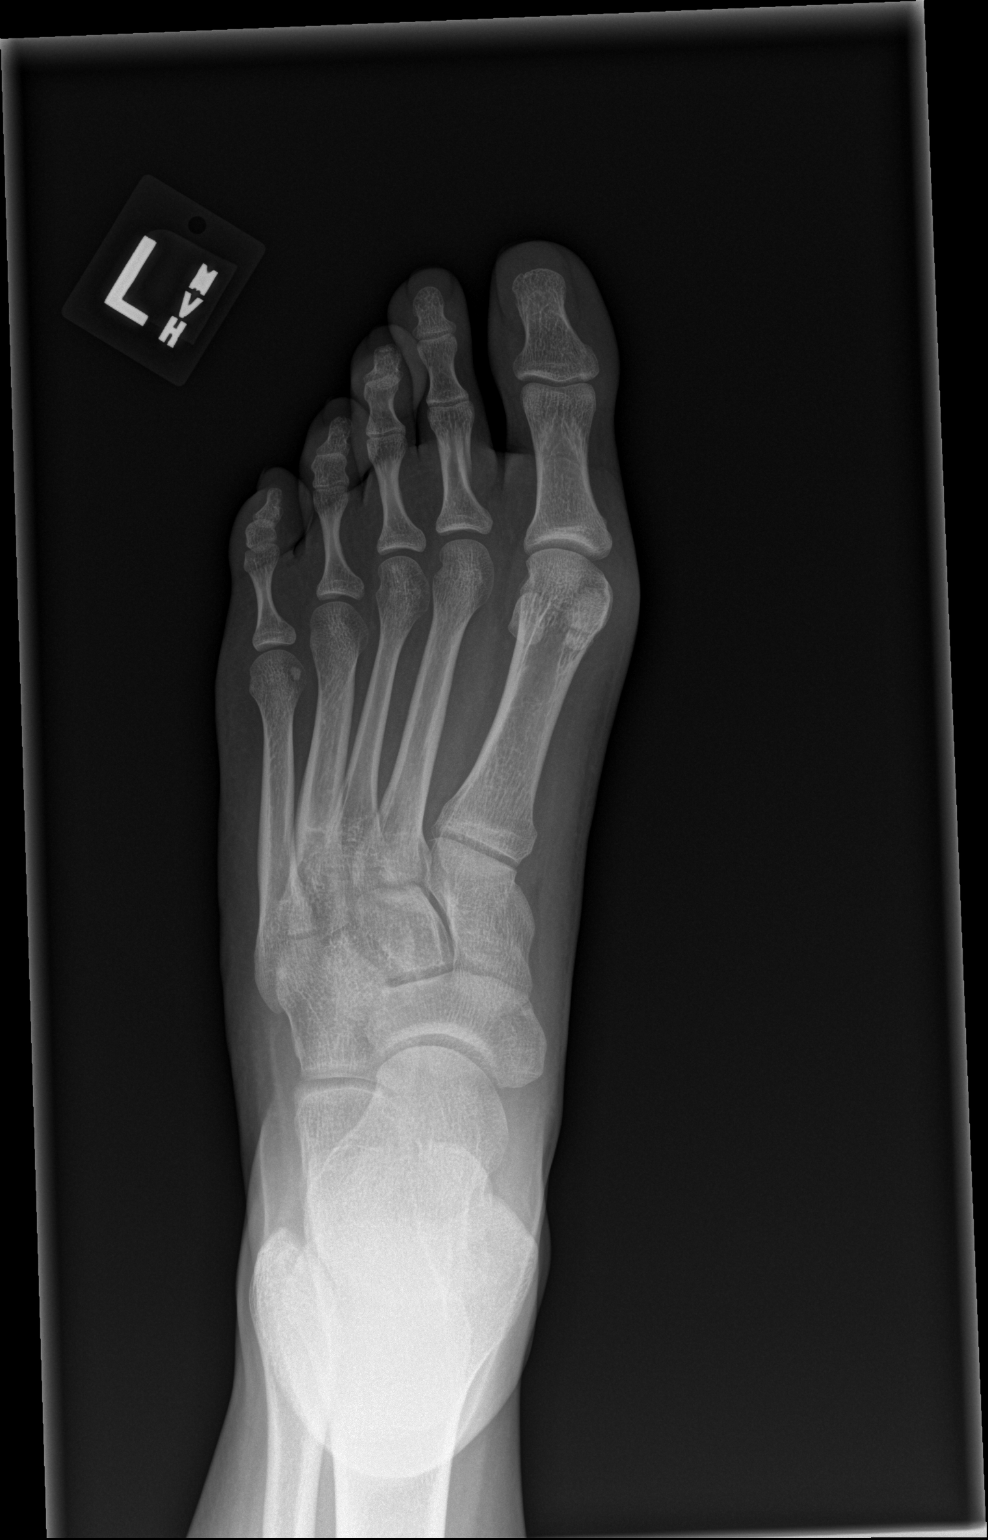

[x foot obl left]
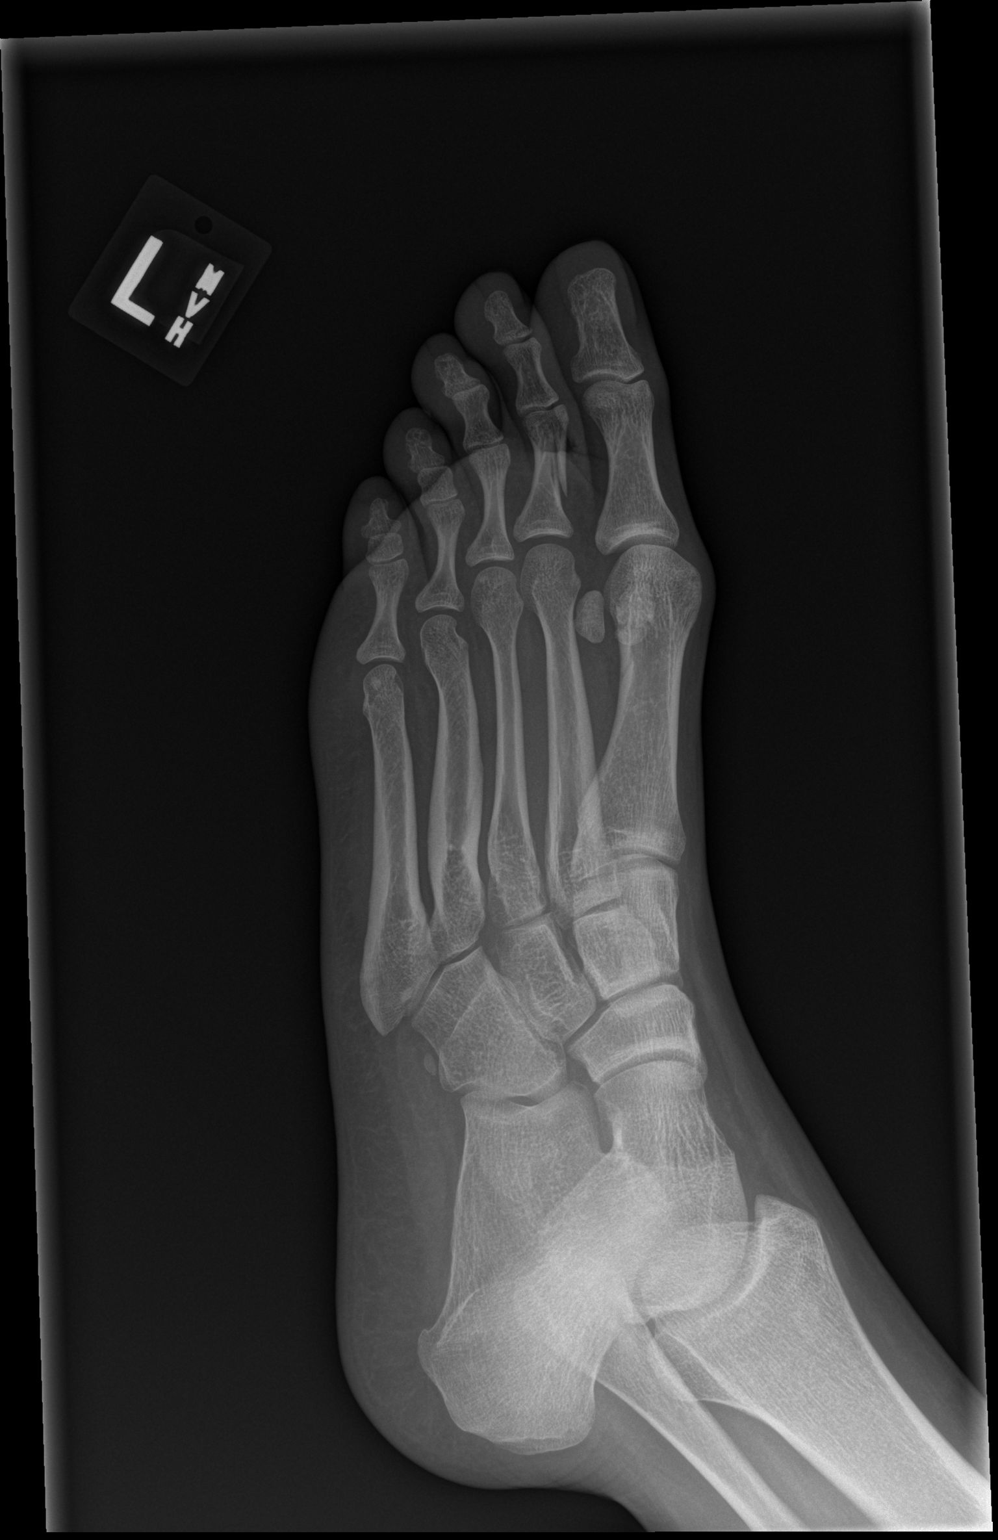

[x foot lat left]
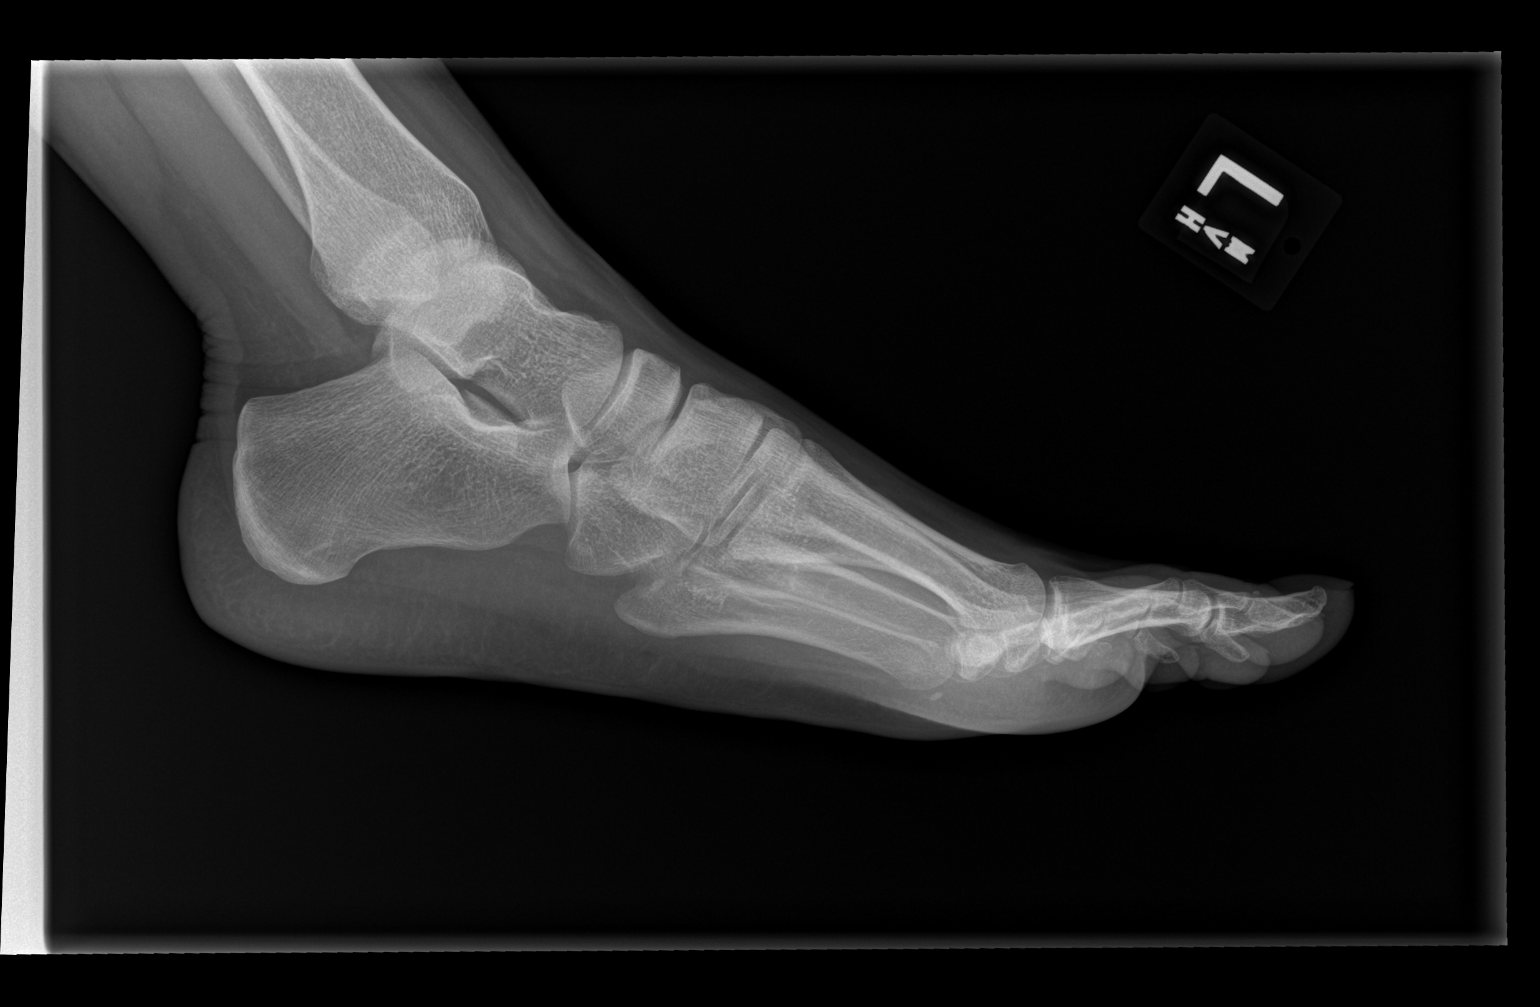

[3 of 3 positions shown; findings below may reference images not displayed]

FINDINGS: There is displaced fracture of the second proximal phalanx. No other
acute displaced fracture or dislocation is identified.
IMPRESSION: Fracture of the second proximal phalanx.
